# Patient Record
Sex: Male | Born: 2000 | Race: White | Hispanic: No | Marital: Single | Smoking: Never smoker
Health system: Southern US, Community
[De-identification: ages and names within clinical notes are randomized; demographics above are authoritative.]

## PROBLEM LIST (undated history)

## (undated) DIAGNOSIS — J302 Other seasonal allergic rhinitis: Secondary | ICD-10-CM

---
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
## 2021-05-03 ENCOUNTER — Institutional Professional Consult (permissible substitution): Payer: 59 | Admitting: Plastic Surgery

## 2021-05-10 ENCOUNTER — Ambulatory Visit: Payer: Managed Care, Other (non HMO) | Admitting: Plastic Surgery

## 2021-05-10 ENCOUNTER — Encounter: Payer: Self-pay | Admitting: Plastic Surgery

## 2021-05-10 ENCOUNTER — Other Ambulatory Visit: Payer: Self-pay

## 2021-05-10 VITALS — BP 109/69 | HR 74 | Ht 71.0 in | Wt 145.0 lb

## 2021-05-10 DIAGNOSIS — L723 Sebaceous cyst: Secondary | ICD-10-CM | POA: Diagnosis not present

## 2021-05-10 NOTE — Progress Notes (Signed)
? ?  Referring Provider ?Loyal Jacobson, MD ?[address] ?[address] ?[place],  [place] [address]  ? ?CC:  ?Chief Complaint  ?Patient presents with  ? Consult  ?   ?  ?   ? ?Kenneth Rice is an 21 y.o. male.  ?HPI: Patient presents to discuss a lesion in his right medial cheek.  Is been present for 2 years and is recently getting larger in size.  It has not drained and has not been previously treated.  It is intermittently painful.  He like to have it removed. ? ?No Known Allergies ? ?Outpatient Encounter Medications as of 05/10/2021  ?Medication Sig  ? [DISCONTINUED] ondansetron (ZOFRAN ODT) 4 MG disintegrating tablet Take 1 tablet (4 mg total) by mouth every 8 (eight) hours as needed for nausea or vomiting.  ? ?No facility-administered encounter medications on file as of 05/10/2021.  ?  ? ?Past Medical History:  ?Diagnosis Date  ? Seasonal allergies   ? ? ?No past surgical history on file. ? ?No family history on file. ? ?Social History  ? ?Social History Narrative  ? Not on file  ?  ? ?Review of Systems ?General: Denies fevers, chills, weight loss ?CV: Denies chest pain, shortness of breath, palpitations ? ?Physical Exam ? ?  05/10/2021  ?  9:54 AM 08/25/2017  ? 10:07 PM 08/25/2017  ?  7:36 PM  ?Vitals with BMI  ?Height 5\' 11"     ?Weight 145 lbs  132 lbs  ?BMI 20.23    ?Systolic 109 116  ?Diastolic 69 67 74  ?Pulse 74 77 63  ?  ?General:  No acute distress,  Alert and oriented, Non-Toxic, Normal speech and affect ?Examination shows a 2 cm subcutaneous mass in the right nasolabial fold.  No overlying skin changes.  It is freely mobile. ? ?Assessment/Plan ?Patient presents with a cystic lesion in the right nasolabial fold.  We discussed excision.  We discussed risks include bleeding, infection, damage to surrounding structures and need for additional procedures.  All of his questions were answered and he is interested in moving forward. ? ?676 ?05/10/2021, 10:32 AM  ? ? ?  ?

## 2021-06-07 ENCOUNTER — Encounter: Payer: Self-pay | Admitting: Plastic Surgery

## 2021-06-07 ENCOUNTER — Ambulatory Visit (INDEPENDENT_AMBULATORY_CARE_PROVIDER_SITE_OTHER): Payer: Managed Care, Other (non HMO) | Admitting: Plastic Surgery

## 2021-06-07 ENCOUNTER — Other Ambulatory Visit (HOSPITAL_COMMUNITY)
Admission: RE | Admit: 2021-06-07 | Discharge: 2021-06-07 | Disposition: A | Discharge status: Home / Self Care | Payer: Managed Care, Other (non HMO) | Source: Ambulatory Visit | Attending: Plastic Surgery | Admitting: Plastic Surgery

## 2021-06-07 VITALS — BP 117/71 | HR 55

## 2021-06-07 DIAGNOSIS — L723 Sebaceous cyst: Secondary | ICD-10-CM

## 2021-06-07 NOTE — Progress Notes (Signed)
Operative Note  ? ?DATE OF OPERATION: 06/07/2021 ? ?LOCATION:   ? ?SURGICAL DEPARTMENT: Plastic Surgery ? ?PREOPERATIVE DIAGNOSES: Right cheek cyst ? ?POSTOPERATIVE DIAGNOSES:  same ? ?PROCEDURE:  ?Excision of right cheek cyst measuring 2.5 cm ?Complex closure measuring 2.5 cm ? ?SURGEON: Ancil Linsey, MD ? ?ANESTHESIA:  Local ? ?COMPLICATIONS: None.  ? ?INDICATIONS FOR PROCEDURE:  ?The patient, Kenneth Rice is a 21 y.o. male born on [birth year], is here for treatment of right cheek cyst ?MRN: [id number] ? ?CONSENT:  ?Informed consent was obtained directly from the patient. Risks, benefits and alternatives were fully discussed. Specific risks including but not limited to bleeding, infection, hematoma, seroma, scarring, pain, infection, wound healing problems, and need for further surgery were all discussed. The patient did have an ample opportunity to have questions answered to satisfaction.  ? ?DESCRIPTION OF PROCEDURE:  ?Local anesthesia was administered. The patient's operative site was prepped and draped in a sterile fashion. A time out was performed and all information was confirmed to be correct.  The lesion was excised with a 15 blade.  Hemostasis was obtained.  Circumferential undermining was performed and the skin was advanced and closed in layers with interrupted buried Monocryl sutures and 5-0 fast gut for the skin.  The lesion excised measured 2.5 cm, and the total length of closure measured 2.5 cm.   ? ?The patient tolerated the procedure well.  There were no complications. ?   ?

## 2021-06-08 LAB — SURGICAL PATHOLOGY

## 2021-06-21 ENCOUNTER — Ambulatory Visit: Payer: Managed Care, Other (non HMO) | Admitting: Plastic Surgery
